# Patient Record
Sex: Female | Born: 1975 | Race: Black or African American | Hispanic: No | Marital: Single | Smoking: Current some day smoker
Health system: Southern US, Community
[De-identification: ages and names within clinical notes are randomized; demographics above are authoritative.]

## PROBLEM LIST (undated history)

## (undated) DIAGNOSIS — G40909 Epilepsy, unspecified, not intractable, without status epilepticus: Secondary | ICD-10-CM

## (undated) HISTORY — PX: ABDOMINAL HYSTERECTOMY: SHX81

## (undated) HISTORY — PX: SKIN GRAFT: SHX250

## (undated) HISTORY — PX: CHOLECYSTECTOMY: SHX55

---

## 2013-10-11 ENCOUNTER — Emergency Department: Payer: Self-pay | Admitting: Emergency Medicine

## 2013-10-12 LAB — URINALYSIS, COMPLETE
Bilirubin,UR: NEGATIVE
Glucose,UR: NEGATIVE mg/dL (ref 0–75)
Ketone: NEGATIVE
Leukocyte Esterase: NEGATIVE
NITRITE: NEGATIVE
Ph: 7 (ref 4.5–8.0)
Protein: NEGATIVE
RBC,UR: 19 /HPF (ref 0–5)
Specific Gravity: 1.018 (ref 1.003–1.030)
WBC UR: 1 /HPF (ref 0–5)

## 2013-10-12 LAB — COMPREHENSIVE METABOLIC PANEL
ALBUMIN: 3.2 g/dL — AB (ref 3.4–5.0)
ALT: 16 U/L
ANION GAP: 8 (ref 7–16)
Alkaline Phosphatase: 61 U/L
BILIRUBIN TOTAL: 0.1 mg/dL — AB (ref 0.2–1.0)
BUN: 20 mg/dL — ABNORMAL HIGH (ref 7–18)
CHLORIDE: 112 mmol/L — AB (ref 98–107)
Calcium, Total: 8.4 mg/dL — ABNORMAL LOW (ref 8.5–10.1)
Co2: 25 mmol/L (ref 21–32)
Creatinine: 0.97 mg/dL (ref 0.60–1.30)
EGFR (African American): >60
EGFR (Non-African Amer.): >60
GLUCOSE: 89 mg/dL (ref 65–99)
OSMOLALITY: 291 (ref 275–301)
POTASSIUM: 3.9 mmol/L (ref 3.5–5.1)
SGOT(AST): 20 U/L (ref 15–37)
Sodium: 145 mmol/L (ref 136–145)
TOTAL PROTEIN: 7 g/dL (ref 6.4–8.2)

## 2013-10-12 LAB — CBC WITH DIFFERENTIAL/PLATELET
Basophil #: 0.1 10*3/uL (ref 0.0–0.1)
Basophil %: 0.7 %
EOS PCT: 4.9 %
Eosinophil #: 0.4 10*3/uL (ref 0.0–0.7)
HCT: 36.5 % (ref 35.0–47.0)
HGB: 12.1 g/dL (ref 12.0–16.0)
Lymphocyte #: 3.5 10*3/uL (ref 1.0–3.6)
Lymphocyte %: 40 %
MCH: 33.3 pg (ref 26.0–34.0)
MCHC: 33.1 g/dL (ref 32.0–36.0)
MCV: 101 fL — ABNORMAL HIGH (ref 80–100)
Monocyte #: 0.6 x10 3/mm (ref 0.2–0.9)
Monocyte %: 7.2 %
Neutrophil #: 4.1 10*3/uL (ref 1.4–6.5)
Neutrophil %: 47.2 %
Platelet: 193 10*3/uL (ref 150–440)
RBC: 3.62 10*6/uL — ABNORMAL LOW (ref 3.80–5.20)
RDW: 15.1 % — ABNORMAL HIGH (ref 11.5–14.5)
WBC: 8.7 10*3/uL (ref 3.6–11.0)

## 2013-12-03 ENCOUNTER — Emergency Department: Payer: Self-pay | Admitting: Emergency Medicine

## 2014-01-16 ENCOUNTER — Emergency Department: Payer: Self-pay | Admitting: Student

## 2014-01-16 LAB — CBC
HCT: 39 % (ref 35.0–47.0)
HGB: 12.4 g/dL (ref 12.0–16.0)
MCH: 32.9 pg (ref 26.0–34.0)
MCHC: 31.8 g/dL — AB (ref 32.0–36.0)
MCV: 104 fL — AB (ref 80–100)
Platelet: 262 10*3/uL (ref 150–440)
RBC: 3.77 10*6/uL — ABNORMAL LOW (ref 3.80–5.20)
RDW: 15.5 % — ABNORMAL HIGH (ref 11.5–14.5)
WBC: 11.2 10*3/uL — AB (ref 3.6–11.0)

## 2014-01-16 LAB — BASIC METABOLIC PANEL
ANION GAP: 8 (ref 7–16)
BUN: 22 mg/dL — AB (ref 7–18)
CALCIUM: 8.5 mg/dL (ref 8.5–10.1)
Chloride: 115 mmol/L — ABNORMAL HIGH (ref 98–107)
Co2: 21 mmol/L (ref 21–32)
Creatinine: 0.9 mg/dL (ref 0.60–1.30)
EGFR (Non-African Amer.): >60
GLUCOSE: 82 mg/dL (ref 65–99)
OSMOLALITY: 289 (ref 275–301)
Potassium: 3.4 mmol/L — ABNORMAL LOW (ref 3.5–5.1)
SODIUM: 144 mmol/L (ref 136–145)

## 2014-01-16 LAB — URINALYSIS, COMPLETE
Bilirubin,UR: NEGATIVE
GLUCOSE, UR: NEGATIVE mg/dL (ref 0–75)
LEUKOCYTE ESTERASE: NEGATIVE
Nitrite: POSITIVE
Ph: 6 (ref 4.5–8.0)
Protein: NEGATIVE
RBC,UR: 45 /HPF (ref 0–5)
Specific Gravity: 1.018 (ref 1.003–1.030)
Squamous Epithelial: 3
WBC UR: 6 /HPF (ref 0–5)

## 2014-01-16 LAB — DRUG SCREEN, URINE
Amphetamines, Ur Screen: NEGATIVE (ref ?–1000)
BENZODIAZEPINE, UR SCRN: NEGATIVE (ref ?–200)
Barbiturates, Ur Screen: NEGATIVE (ref ?–200)
CANNABINOID 50 NG, UR ~~LOC~~: NEGATIVE (ref ?–50)
COCAINE METABOLITE, UR ~~LOC~~: POSITIVE (ref ?–300)
MDMA (Ecstasy)Ur Screen: NEGATIVE (ref ?–500)
METHADONE, UR SCREEN: NEGATIVE (ref ?–300)
OPIATE, UR SCREEN: NEGATIVE (ref ?–300)
Phencyclidine (PCP) Ur S: NEGATIVE (ref ?–25)
Tricyclic, Ur Screen: NEGATIVE (ref ?–1000)

## 2014-01-24 ENCOUNTER — Emergency Department: Payer: Self-pay | Admitting: Internal Medicine

## 2014-01-24 LAB — COMPREHENSIVE METABOLIC PANEL
ALK PHOS: 69 U/L
ALT: 26 U/L
AST: 23 U/L (ref 15–37)
Albumin: 3.5 g/dL (ref 3.4–5.0)
Anion Gap: 9 (ref 7–16)
BILIRUBIN TOTAL: 0.3 mg/dL (ref 0.2–1.0)
BUN: 23 mg/dL — ABNORMAL HIGH (ref 7–18)
CHLORIDE: 114 mmol/L — AB (ref 98–107)
CREATININE: 1.1 mg/dL (ref 0.60–1.30)
Calcium, Total: 9 mg/dL (ref 8.5–10.1)
Co2: 24 mmol/L (ref 21–32)
EGFR (African American): >60
EGFR (Non-African Amer.): 58 — ABNORMAL LOW
Glucose: 130 mg/dL — ABNORMAL HIGH (ref 65–99)
OSMOLALITY: 298 (ref 275–301)
Potassium: 4 mmol/L (ref 3.5–5.1)
Sodium: 147 mmol/L — ABNORMAL HIGH (ref 136–145)
Total Protein: 7.4 g/dL (ref 6.4–8.2)

## 2014-01-24 LAB — URINALYSIS, COMPLETE
BILIRUBIN, UR: NEGATIVE
Bacteria: NONE SEEN
Glucose,UR: NEGATIVE mg/dL (ref 0–75)
Ketone: NEGATIVE
LEUKOCYTE ESTERASE: NEGATIVE
Nitrite: NEGATIVE
Ph: 6 (ref 4.5–8.0)
Protein: NEGATIVE
RBC,UR: 19 /HPF (ref 0–5)
Specific Gravity: 1.009 (ref 1.003–1.030)
Squamous Epithelial: <1
WBC UR: 4 /HPF (ref 0–5)

## 2014-01-24 LAB — CBC
HCT: 41.7 % (ref 35.0–47.0)
HGB: 13 g/dL (ref 12.0–16.0)
MCH: 32.7 pg (ref 26.0–34.0)
MCHC: 31.1 g/dL — AB (ref 32.0–36.0)
MCV: 105 fL — ABNORMAL HIGH (ref 80–100)
Platelet: 277 10*3/uL (ref 150–440)
RBC: 3.97 10*6/uL (ref 3.80–5.20)
RDW: 16 % — ABNORMAL HIGH (ref 11.5–14.5)
WBC: 11.6 10*3/uL — ABNORMAL HIGH (ref 3.6–11.0)

## 2014-01-26 ENCOUNTER — Emergency Department: Payer: Self-pay | Admitting: Student

## 2014-01-26 LAB — COMPREHENSIVE METABOLIC PANEL
ALK PHOS: 67 U/L
ANION GAP: 10 (ref 7–16)
Albumin: 3.6 g/dL (ref 3.4–5.0)
BUN: 23 mg/dL — ABNORMAL HIGH (ref 7–18)
Bilirubin,Total: 0.2 mg/dL (ref 0.2–1.0)
CHLORIDE: 114 mmol/L — AB (ref 98–107)
CO2: 22 mmol/L (ref 21–32)
Calcium, Total: 8.2 mg/dL — ABNORMAL LOW (ref 8.5–10.1)
Creatinine: 0.84 mg/dL (ref 0.60–1.30)
EGFR (African American): >60
Glucose: 88 mg/dL (ref 65–99)
Osmolality: 294 (ref 275–301)
Potassium: 3.7 mmol/L (ref 3.5–5.1)
SGOT(AST): 26 U/L (ref 15–37)
SGPT (ALT): 26 U/L
SODIUM: 146 mmol/L — AB (ref 136–145)
TOTAL PROTEIN: 7 g/dL (ref 6.4–8.2)

## 2014-01-26 LAB — URINALYSIS, COMPLETE
Bilirubin,UR: NEGATIVE
GLUCOSE, UR: NEGATIVE mg/dL (ref 0–75)
Ketone: NEGATIVE
Leukocyte Esterase: NEGATIVE
Nitrite: NEGATIVE
PROTEIN: NEGATIVE
Ph: 7 (ref 4.5–8.0)
RBC,UR: 75 /HPF (ref 0–5)
Specific Gravity: 1.015 (ref 1.003–1.030)

## 2014-01-26 LAB — DRUG SCREEN, URINE
Amphetamines, Ur Screen: NEGATIVE (ref ?–1000)
BENZODIAZEPINE, UR SCRN: NEGATIVE (ref ?–200)
Barbiturates, Ur Screen: NEGATIVE (ref ?–200)
Cannabinoid 50 Ng, Ur ~~LOC~~: NEGATIVE (ref ?–50)
Cocaine Metabolite,Ur ~~LOC~~: NEGATIVE (ref ?–300)
MDMA (Ecstasy)Ur Screen: NEGATIVE (ref ?–500)
Methadone, Ur Screen: NEGATIVE (ref ?–300)
Opiate, Ur Screen: POSITIVE (ref ?–300)
PHENCYCLIDINE (PCP) UR S: NEGATIVE (ref ?–25)
Tricyclic, Ur Screen: NEGATIVE (ref ?–1000)

## 2014-01-26 LAB — LIPASE, BLOOD: Lipase: 122 U/L (ref 73–393)

## 2014-01-26 LAB — CBC
HCT: 37.1 % (ref 35.0–47.0)
HGB: 11.9 g/dL — ABNORMAL LOW (ref 12.0–16.0)
MCH: 33.7 pg (ref 26.0–34.0)
MCHC: 32.2 g/dL (ref 32.0–36.0)
MCV: 105 fL — ABNORMAL HIGH (ref 80–100)
Platelet: 282 10*3/uL (ref 150–440)
RBC: 3.54 10*6/uL — AB (ref 3.80–5.20)
RDW: 16.1 % — ABNORMAL HIGH (ref 11.5–14.5)
WBC: 12.2 10*3/uL — ABNORMAL HIGH (ref 3.6–11.0)

## 2014-01-26 LAB — WET PREP, GENITAL

## 2014-01-27 LAB — GC/CHLAMYDIA PROBE AMP

## 2014-01-28 LAB — URINE CULTURE

## 2014-06-13 ENCOUNTER — Emergency Department: Admit: 2014-06-13 | Discharge status: Against Medical Advice | Payer: Self-pay | Admitting: Emergency Medicine

## 2014-06-13 ENCOUNTER — Emergency Department
Admit: 2014-06-13 | Disposition: A | Discharge status: Home / Self Care | Payer: Self-pay | Admitting: Emergency Medicine

## 2014-06-14 LAB — URINALYSIS, COMPLETE
BILIRUBIN, UR: NEGATIVE
Bacteria: NONE SEEN
Glucose,UR: NEGATIVE mg/dL (ref 0–75)
Ketone: NEGATIVE
Leukocyte Esterase: NEGATIVE
Nitrite: NEGATIVE
PH: 6 (ref 4.5–8.0)
PROTEIN: NEGATIVE
Specific Gravity: 1.005 (ref 1.003–1.030)

## 2014-08-21 ENCOUNTER — Encounter: Payer: Self-pay | Admitting: *Deleted

## 2014-08-21 ENCOUNTER — Emergency Department: Payer: Medicaid - Out of State

## 2014-08-21 ENCOUNTER — Emergency Department
Admission: EM | Admit: 2014-08-21 | Discharge: 2014-08-21 | Disposition: A | Discharge status: Home / Self Care | Payer: Medicaid - Out of State | Attending: Emergency Medicine | Admitting: Emergency Medicine

## 2014-08-21 ENCOUNTER — Other Ambulatory Visit: Payer: Self-pay

## 2014-08-21 DIAGNOSIS — R1011 Right upper quadrant pain: Secondary | ICD-10-CM | POA: Diagnosis present

## 2014-08-21 DIAGNOSIS — Z3202 Encounter for pregnancy test, result negative: Secondary | ICD-10-CM | POA: Insufficient documentation

## 2014-08-21 DIAGNOSIS — K5900 Constipation, unspecified: Secondary | ICD-10-CM | POA: Insufficient documentation

## 2014-08-21 DIAGNOSIS — R112 Nausea with vomiting, unspecified: Secondary | ICD-10-CM | POA: Diagnosis not present

## 2014-08-21 DIAGNOSIS — Z720 Tobacco use: Secondary | ICD-10-CM | POA: Diagnosis not present

## 2014-08-21 DIAGNOSIS — Z880 Allergy status to penicillin: Secondary | ICD-10-CM | POA: Diagnosis not present

## 2014-08-21 HISTORY — DX: Epilepsy, unspecified, not intractable, without status epilepticus: G40.909

## 2014-08-21 LAB — CBC WITH DIFFERENTIAL/PLATELET
Basophils Absolute: 0.1 10*3/uL (ref 0–0.1)
Basophils Relative: 1 %
EOS ABS: 0.2 10*3/uL (ref 0–0.7)
Eosinophils Relative: 3 %
HEMATOCRIT: 38.4 % (ref 35.0–47.0)
HEMOGLOBIN: 12.5 g/dL (ref 12.0–16.0)
LYMPHS ABS: 2.6 10*3/uL (ref 1.0–3.6)
Lymphocytes Relative: 27 %
MCH: 32.6 pg (ref 26.0–34.0)
MCHC: 32.6 g/dL (ref 32.0–36.0)
MCV: 100.1 fL — ABNORMAL HIGH (ref 80.0–100.0)
MONO ABS: 0.5 10*3/uL (ref 0.2–0.9)
MONOS PCT: 6 %
Neutro Abs: 6.3 10*3/uL (ref 1.4–6.5)
Neutrophils Relative %: 65 %
Platelets: 236 10*3/uL (ref 150–440)
RBC: 3.84 MIL/uL (ref 3.80–5.20)
RDW: 15.4 % — ABNORMAL HIGH (ref 11.5–14.5)
WBC: 9.7 10*3/uL (ref 3.6–11.0)

## 2014-08-21 LAB — COMPREHENSIVE METABOLIC PANEL
ALBUMIN: 3.9 g/dL (ref 3.5–5.0)
ALK PHOS: 65 U/L (ref 38–126)
ALT: 14 U/L (ref 14–54)
AST: 21 U/L (ref 15–41)
Anion gap: 5 (ref 5–15)
BILIRUBIN TOTAL: 0.4 mg/dL (ref 0.3–1.2)
BUN: 17 mg/dL (ref 6–20)
CO2: 22 mmol/L (ref 22–32)
CREATININE: 0.8 mg/dL (ref 0.44–1.00)
Calcium: 8.7 mg/dL — ABNORMAL LOW (ref 8.9–10.3)
Chloride: 112 mmol/L — ABNORMAL HIGH (ref 101–111)
GFR calc Af Amer: >60 mL/min (ref >60)
Glucose, Bld: 103 mg/dL — ABNORMAL HIGH (ref 65–99)
Potassium: 3.8 mmol/L (ref 3.5–5.1)
Sodium: 139 mmol/L (ref 135–145)
Total Protein: 7.4 g/dL (ref 6.5–8.1)

## 2014-08-21 LAB — LIPASE, BLOOD: LIPASE: 26 U/L (ref 22–51)

## 2014-08-21 LAB — TROPONIN I

## 2014-08-21 MED ORDER — HYDROMORPHONE HCL 1 MG/ML IJ SOLN
INTRAMUSCULAR | Status: AC
  Filled 2014-08-21: qty 1

## 2014-08-21 MED ORDER — IOHEXOL 240 MG/ML SOLN
25.0000 mL | Freq: Once | INTRAMUSCULAR | Status: AC | PRN
  Administered 2014-08-21: 25 mL via ORAL
  Filled 2014-08-21: qty 25

## 2014-08-21 MED ORDER — ONDANSETRON HCL 4 MG/2ML IJ SOLN
4.0000 mg | Freq: Once | INTRAMUSCULAR | Status: AC
  Administered 2014-08-21: 4 mg via INTRAVENOUS

## 2014-08-21 MED ORDER — IOHEXOL 300 MG/ML  SOLN
75.0000 mL | Freq: Once | INTRAMUSCULAR | Status: AC | PRN
  Administered 2014-08-21: 75 mL via INTRAVENOUS
  Filled 2014-08-21: qty 75

## 2014-08-21 MED ORDER — ONDANSETRON HCL 4 MG/2ML IJ SOLN
INTRAMUSCULAR | Status: AC
  Administered 2014-08-21: 4 mg via INTRAVENOUS
  Filled 2014-08-21: qty 2

## 2014-08-21 MED ORDER — HYDROMORPHONE HCL 1 MG/ML IJ SOLN
0.5000 mg | INTRAMUSCULAR | Status: DC | PRN
  Administered 2014-08-21: 17:00:00 via INTRAVENOUS

## 2014-08-21 MED ORDER — SODIUM CHLORIDE 0.9 % IV BOLUS (SEPSIS)
1000.0000 mL | Freq: Once | INTRAVENOUS | Status: AC
  Administered 2014-08-21: 1000 mL via INTRAVENOUS

## 2014-08-21 NOTE — ED Provider Notes (Signed)
[facility] Emergency Department Provider Note  ____________________________________________  Time seen: 1625  I have reviewed the triage vital signs and the nursing notes.   HISTORY  Chief Complaint Abdominal Pain     HPI Valerie Neal is a 39 y.o. 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 Today she presents to the emergency department with ongoing abdominal pain. This began on Monday. She is working overnight shifts and on Monday morning she had pain and nausea. She began to eat some breakfast but threw that up. She was not able to eat anything on Monday. On Tuesday she continued to have nausea. Last night she worked again and this morning before she got off she started to have increasing pain. This pain is in the right upper quadrant. It radiates through to her back. She went home to try to rest. The pain worsened and so she presented to the emergency department.  She denies any diarrhea, or fever.  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   Past Medical History  Diagnosis Date  . Epilepsy     There are no active problems to display for this patient.   Past Surgical History  Procedure Laterality Date  . Cholecystectomy    . Abdominal hysterectomy    . Skin graft      No current outpatient prescriptions on file.  Allergies Penicillins  No family history on file.  Social History History  Substance Use Topics  . Smoking status: Current Some Day Smoker  . Smokeless tobacco: Not on file  . Alcohol Use: No    Review of Systems  Constitutional: Negative for fever. ENT: Negative for sore throat. Cardiovascular: Negative for chest pain. Respiratory: Negative for shortness of breath. Gastrointestinal: Positive for abdominal pain and vomiting. No diarrhea. See history of present illness Genitourinary: Negative for  dysuria. Musculoskeletal: No myalgias or injuries. Skin: Negative for rash. Neurological: Negative for headaches   10-point ROS otherwise negative.  ____________________________________________   PHYSICAL EXAM:  VITAL SIGNS: ED Triage Vitals  Enc Vitals Group     BP 08/21/14 1315 137/91 mmHg     Pulse Rate 08/21/14 1315 102     Resp 08/21/14 1602 16     Temp 08/21/14 1315 98 F (36.7 C)     Temp Source 08/21/14 1315 Oral     SpO2 08/21/14 1602 100 %     Weight 08/21/14 1315 187 lb (84.823 kg)     Height 08/21/14 1315  (1.6 m)     Head Cir --      Peak Flow --      Pain Score 08/21/14 1316 10     Pain Loc --      Pain Edu? --      Excl. in GC? --     Constitutional:  Alert and oriented. Pleasant and very good historian. She appears a little uncomfortable. ENT   Head: Normocephalic and atraumatic.   Nose: No congestion/rhinnorhea. Cardiovascular: Normal rate, regular rhythm, no murmur noted Respiratory:  Normal respiratory effort, no tachypnea.    Breath sounds are clear and equal bilaterally.  Gastrointestinal: Soft. Notable tenderness, but worse in the right upper quadrant.  Back: No muscle spasm, no tenderness. She does have some CVA tenderness on the right. Musculoskeletal: No deformity noted. Nontender with normal range of motion in all extremities.  No noted edema. Neurologic:  Normal  speech and language. No gross focal neurologic deficits are appreciated.  Skin:  Skin is warm, dry. No rash noted. Psychiatric: Mood and affect are normal. Speech and behavior are normal.  ____________________________________________    LABS (pertinent positives/negatives)  CBC: Within normal limits with white blood cell count 9.7, hemoglobin of 12.5 Metabolic panel overall unremarkable with normal renal function and normal electrolytes. Normal LFTs. Lipase is normal at 26 Troponin is normal at less than  0.03.   ____________________________________________   EKG  ED ECG REPORT I, Keyia Moretto W, the attending physician, personally viewed and interpreted this ECG.   Date: 08/21/2014  EKG Time: 1326  Rate: 88  Rhythm: Normal sinus rhythm  Axis: Normal  Intervals: Normal  ST&T Change: None noted   ____________________________________________    RADIOLOGY  CT scan abdomen and pelvis:  IMPRESSION: 1. No acute findings in the abdomen pelvis. 2. Post cholecystectomy. 3. Post hysterectomy without pelvic infection, inflammation, or tumor recurrence. 4. Moderate volume stool throughout the colon. 5. Normal appendix.   ____________________________________________   INITIAL IMPRESSION / ASSESSMENT AND PLAN / ED COURSE  Pertinent labs & imaging results that were available during my care of the patient were reviewed by me and considered in my medical decision making (see chart for details).  Patient was complex history of having had a cholecystectomy last year as well as a hysterectomy and now with pain in the upper right quadrant with diffuse pain elsewhere. Without a gallbladder, I think the yield of ultrasound would be low. I am concerned about small the form of complication given her pain in the right upper quadrant and extending to the right back. I discussed this with the patient and she agrees that a CT scan of the abdomen and pelvis is appropriate. We will give her pain medication and IV fluids and obtain the CT.  ----------------------------------------- 6:37 PM on 08/21/2014 -----------------------------------------  CT scan does not find any acute issues. Reassessment of the patient finds her comfortable, but at this time the patient now reveals that she has not had a bowel movement for approximately one month. She reports she told [facility] he also the emergency department but she had not told me. She reports that ever since she had been on a narcotic for some pain  control issues, she has had terrible constipation. She is no longer on a narcotic. She reports she takes MiraLAX 3 times a day without benefit. She also tells me she seen a gastroenterologist about this before and was given her additional advice. She is taking lactulose in the past as well. His rather remarkable how much history there is about her constipation that she did not share with me initially.  We have reviewed treatment for constipation including enemas at home, glycerin suppositories, use of MiraLAX, stool softeners, and other agents. Now that she no she does not have a surgical problem, she feels more comfortable and more motivated to address her 1 month long constipation.   ____________________________________________   FINAL CLINICAL IMPRESSION(S) / ED DIAGNOSES  Final diagnoses:  Right upper quadrant pain  Constipation, unspecified constipation type      Darien Ramusavid W Zebulen Simonis, MD 08/21/14 [phone]

## 2014-08-21 NOTE — ED Notes (Signed)
Pt reports abdominal pain starting Monday with nausea and vomiting

## 2014-08-21 NOTE — Discharge Instructions (Signed)
Continue to take MiraLAX 2-3 times a day as you have been. Add fiber products such as Metamucil. He may also add an oil. I advised patient's to try flaxseed oil 1 tablespoon twice a day. No oral treatment is likely able to help the hard stool you have at the end of your coloncurrently. For this you will need to use an enema. You may use multiple enemas.  Follow-up at cannot a clinic locally or a physician in [place] as you were there or in [place] when you return. Return to the emergency department if you have worsening pain or other urgent concerns.    Constipation Constipation is when a person:  Poops (has a bowel movement) less than 3 times a week.  Has a hard time pooping.  Has poop that is dry, hard, or bigger than normal. HOME CARE   Eat foods with a lot of fiber in them. This includes fruits, vegetables, beans, and whole grains such as brown rice.  Avoid fatty foods and foods with a lot of sugar. This includes french fries, hamburgers, cookies, candy, and soda.  If you are not getting enough fiber from food, take products with added fiber in them (supplements).  Drink enough fluid to keep your pee (urine) clear or pale yellow.  Exercise on a regular basis, or as told by your doctor.  Go to the restroom when you feel like you need to poop. Do not hold it.  Only take medicine as told by your doctor. Do not take medicines that help you poop (laxatives) without talking to your doctor first. GET HELP RIGHT AWAY IF:   You have bright red blood in your poop (stool).  Your constipation lasts more than 4 days or gets worse.  You have belly (abdominal) or butt (rectal) pain.  You have thin poop (as thin as a pencil).  You lose weight, and it cannot be explained. MAKE SURE YOU:   Understand these instructions.  Will watch your condition.  Will get help right away if you are not doing well or get worse. Document Released: 07/28/2007 Document Revised: 02/13/2013  Document Reviewed: 11/20/2012 [facility] Patient Information 2015 [employer] [place]. This information is not intended to replace advice given to you by your health care provider. Make sure you discuss any questions you have with your health care provider.

## 2014-08-23 LAB — POCT PREGNANCY, URINE: PREG TEST UR: NEGATIVE

## 2015-08-18 IMAGING — US US PELV - US TRANSVAGINAL
1 series · 14 of 25 positions shown · non-contrast
Comparison: CT 01/26/2014

CLINICAL DATA: Bilateral adnexal pain for 3 days.

EXAM:
TRANSABDOMINAL AND TRANSVAGINAL ULTRASOUND OF PELVIS
TECHNIQUE: Both transabdominal and transvaginal ultrasound examinations of the
pelvis were performed. Transabdominal technique was performed for
global imaging of the pelvis including uterus, ovaries, adnexal
regions, and pelvic cul-de-sac. It was necessary to proceed with
endovaginal exam following the transabdominal exam to visualize the
adnexal structures.

[Series 1: us pelv - us transvaginal · 0.21mm/px · 14 of 69 slices shown]
[im 1/69]
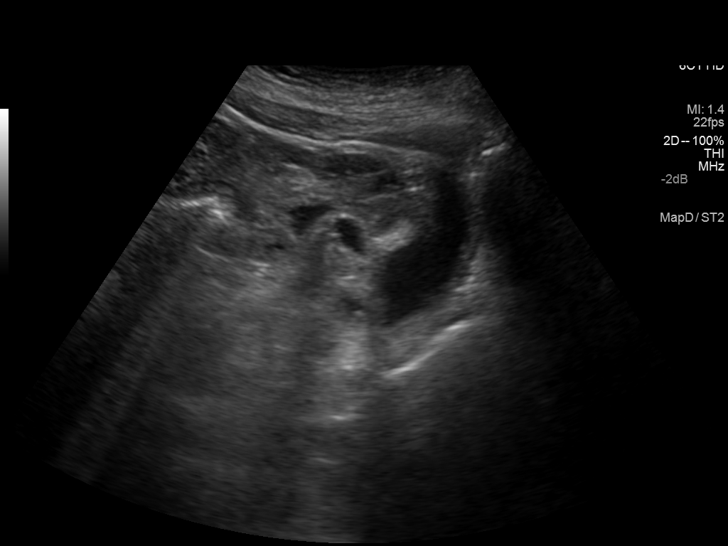
[im 6/69]
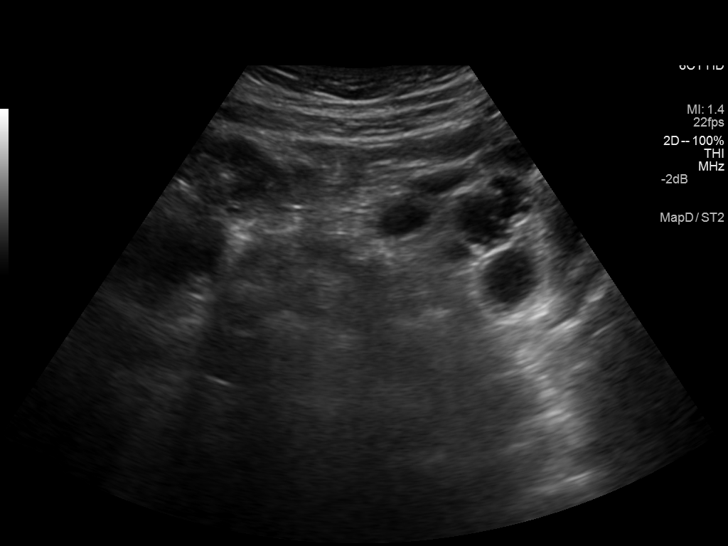
[im 12/69]
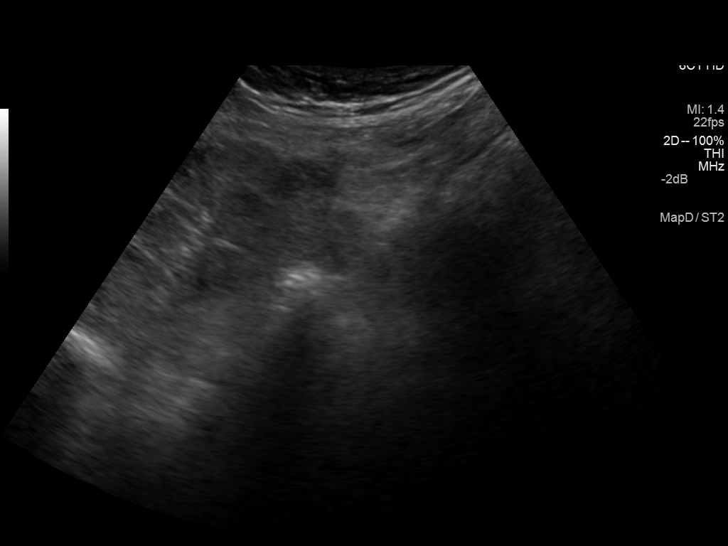
[im 18/69]
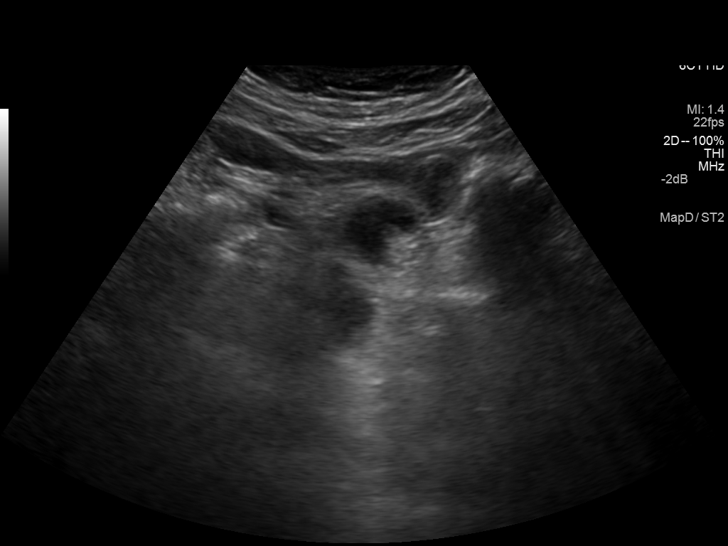
[im 23/69]
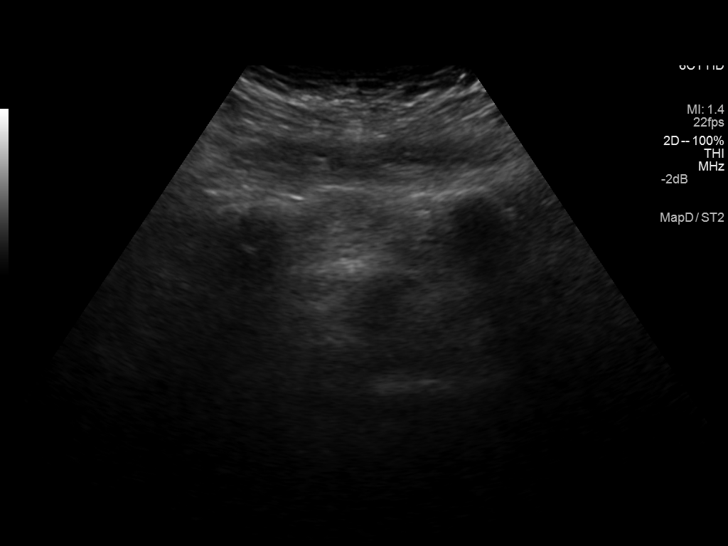
[im 26/69]
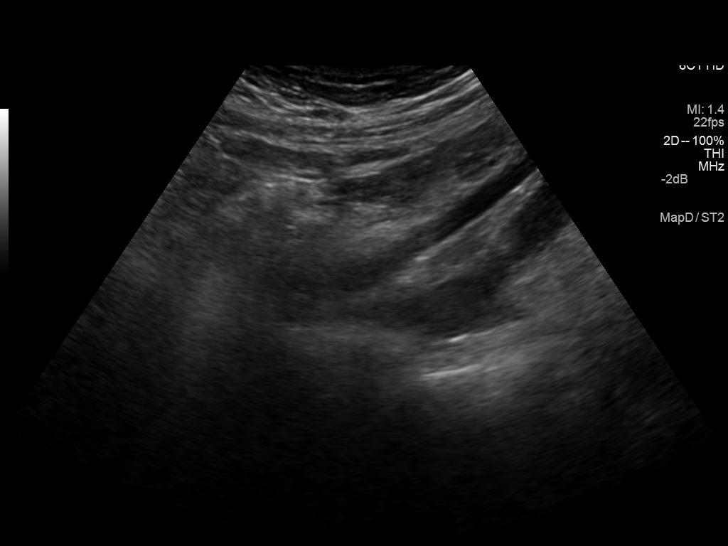
[im 32/69]
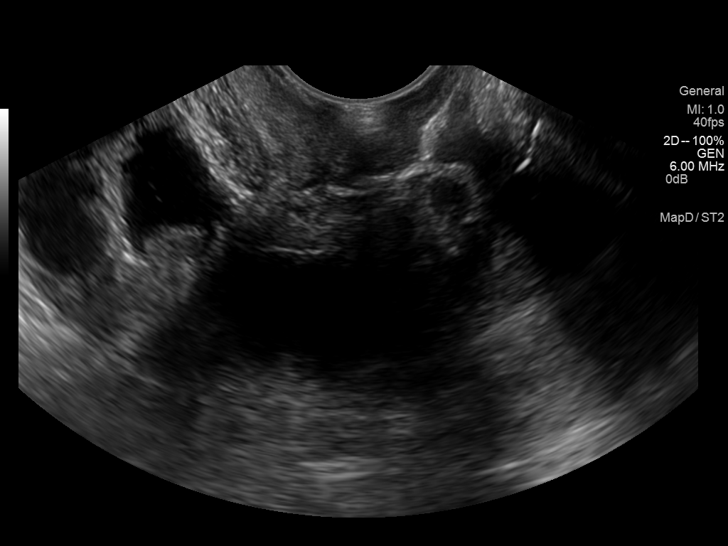
[im 37/69]
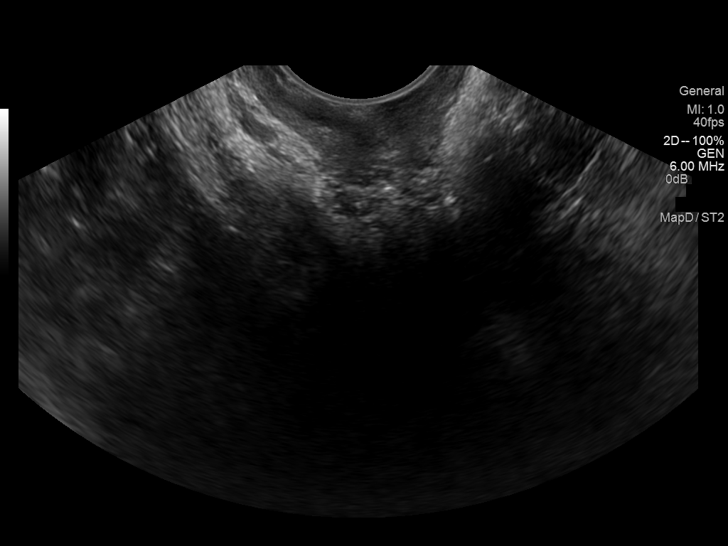
[im 43/69]
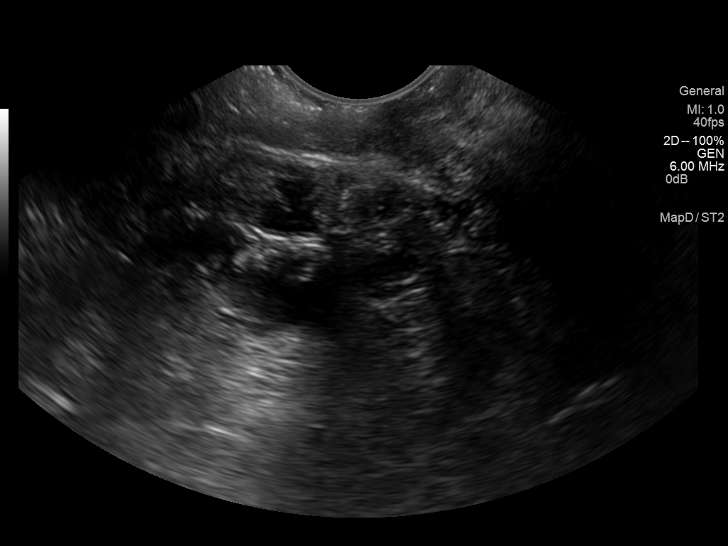
[im 46/69]
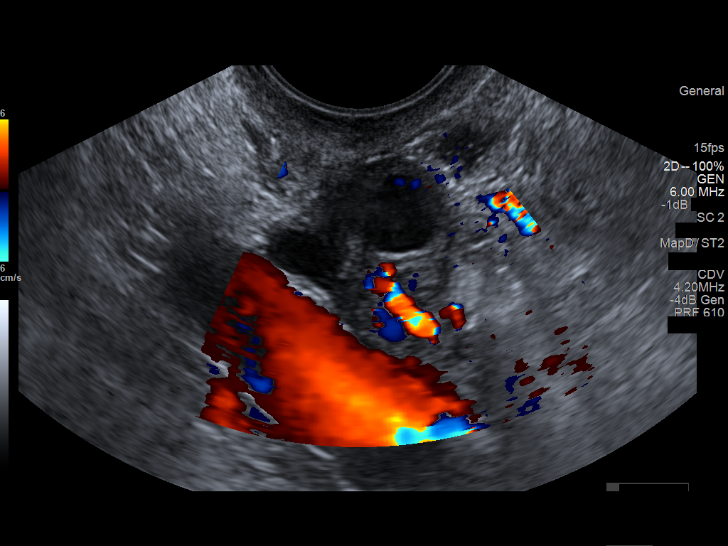
[im 52/69]
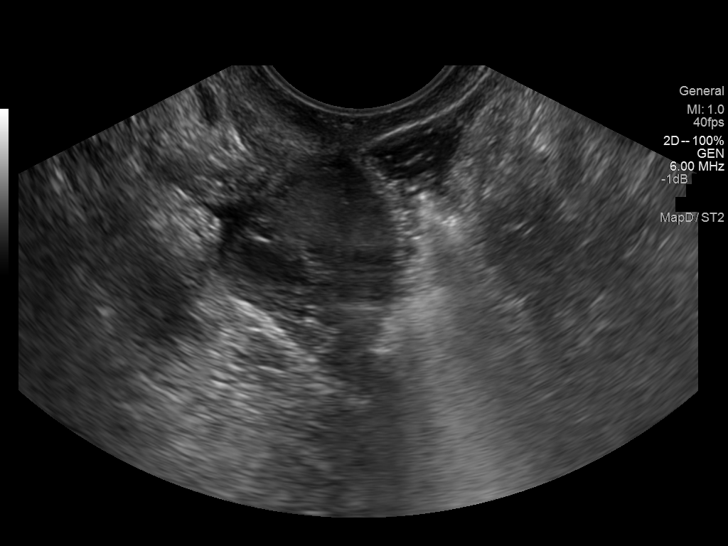
[im 57/69]
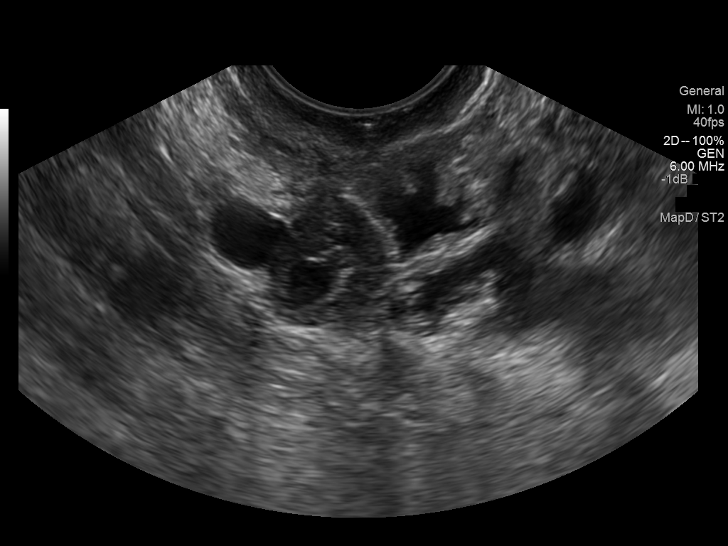
[im 63/69]
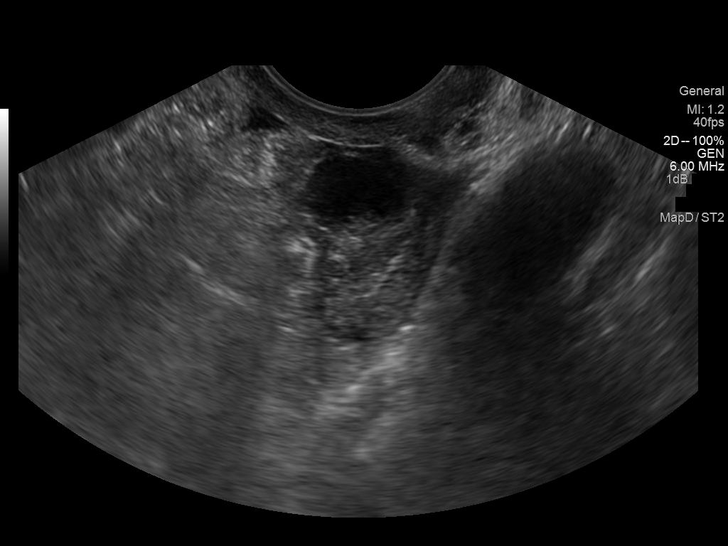
[im 69/69]
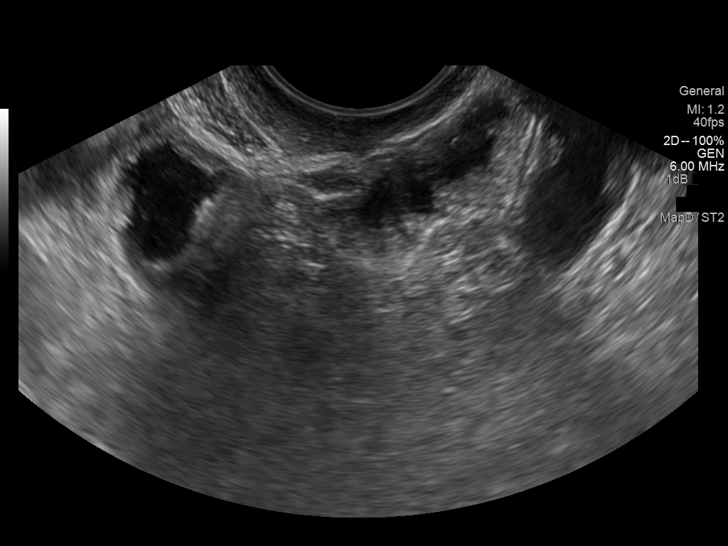

[14 of 25 positions shown; findings below may reference images not displayed]

FINDINGS: Uterus

Surgically absent

Right ovary

Measurements: 2.4 x 1.8 x 2.2 cm. Normal appearance/no adnexal mass.

Left ovary

Measurements: 2.4 x 1.4 x 1.7 cm. Normal appearance/no adnexal mass.

Other findings

No free fluid.
IMPRESSION: Normal right and left ovary.

Surgically absent uterus.

## 2015-08-18 IMAGING — CT CT ABD-PELV W/O CM
1 of 4 series · 2 of 46 positions shown, 5 images · non-contrast
Comparison: 10/12/2013 CT abdomen/ pelvis, renal ultrasound
01/24/2014

CLINICAL DATA: Right flank pain for 1 week radiating to the groin,
nausea and vomiting

EXAM:
CT ABDOMEN AND PELVIS WITHOUT CONTRAST
TECHNIQUE: Multidetector CT imaging of the abdomen and pelvis was performed
following the standard protocol without IV contrast.

[Series 4: lung · axial · 0.71mm/px · z∈[-249,-234]mm · 2 of 9 slices shown, 5 images]
[im 3/9  soft-tissue]
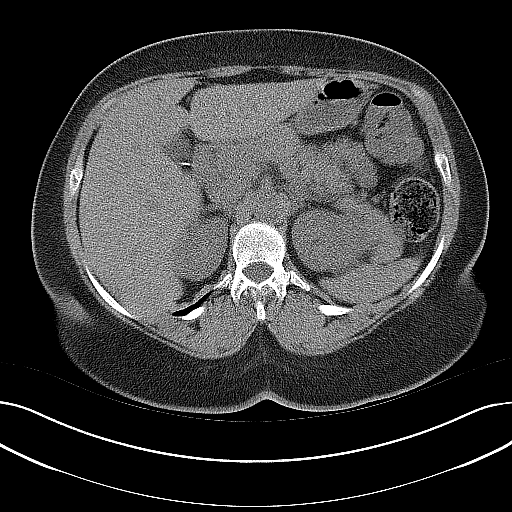
[im 3/9  lung]
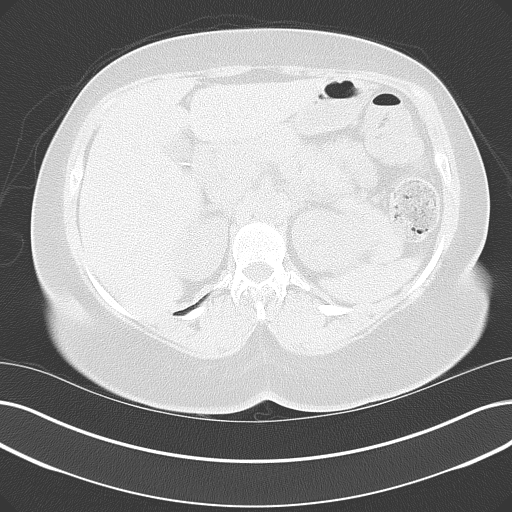
[im 3/9  bone]
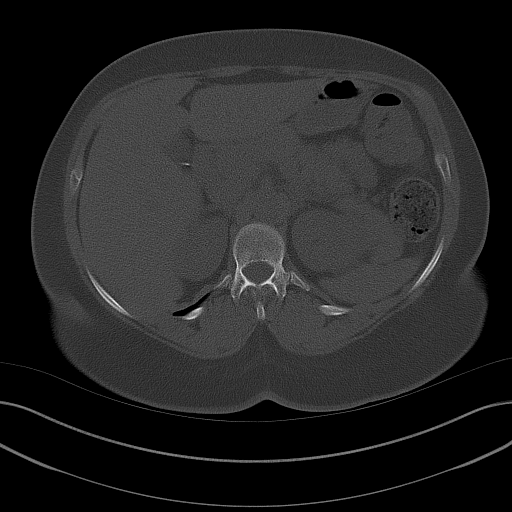
[im 6/9  soft-tissue]
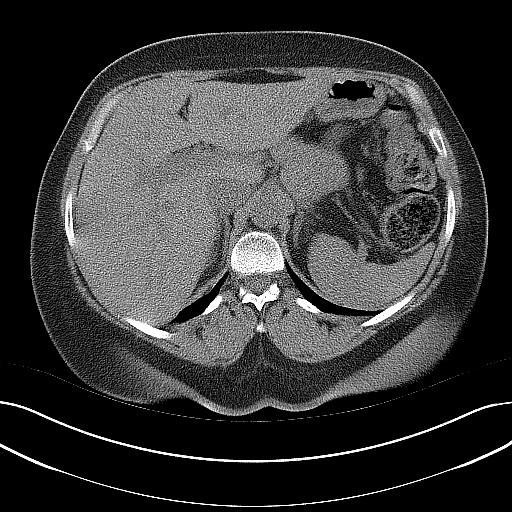
[im 6/9  lung]
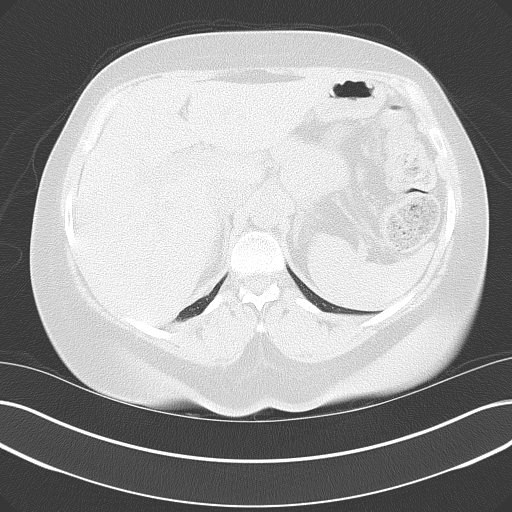

[2 of 46 positions shown; findings below may reference images not displayed]

FINDINGS: Cholecystectomy clips are noted. Adrenal glands and pancreas are
unremarkable. Nonobstructing 1 mm left mid renal calculi image 15. 1
mm calcification within the right anatomic pelvis image 59 is
stable, thought to be lateral to the expected course of the ureter
and likely a phlebolith, as well as a similar appearing 1 mm
calcification along the right lateral pelvic sidewall image 56,
compatible with a phlebolith and stable. No definite radiopaque
ureteral or bladder calculus is identified. No
hydroureteronephrosis.

No bowel wall thickening or focal segmental dilatation is
identified. Trace pelvic free fluid is identified, likely
physiologic. Appendix is normal, image 47. No lymphadenopathy. No
free air. No acute osseous abnormality.
IMPRESSION: No etiology to explain the history of right flank pain. Two presumed
1 mm phleboliths in the right lateral pelvis are stable.
